# Patient Record
Sex: Female | Born: 1990 | Race: Black or African American | Hispanic: No | Marital: Single | Smoking: Never smoker
Health system: Southern US, Academic
[De-identification: ages and names within clinical notes are randomized; demographics above are authoritative.]

---
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
## 2009-05-04 ENCOUNTER — Emergency Department (HOSPITAL_COMMUNITY): Admission: EM | Admit: 2009-05-04 | Discharge: 2009-05-04 | Payer: Self-pay | Admitting: Family Medicine

## 2009-06-20 ENCOUNTER — Emergency Department (HOSPITAL_COMMUNITY): Admission: EM | Admit: 2009-06-20 | Discharge: 2009-06-20 | Payer: Self-pay | Admitting: Family Medicine

## 2010-05-19 LAB — POCT URINALYSIS DIP (DEVICE)
Glucose, UA: NEGATIVE mg/dL
Hgb urine dipstick: NEGATIVE
Nitrite: NEGATIVE

## 2010-06-03 LAB — POCT URINALYSIS DIP (DEVICE)
Bilirubin Urine: NEGATIVE
Glucose, UA: NEGATIVE mg/dL
Nitrite: NEGATIVE

## 2010-06-03 LAB — WET PREP, GENITAL: Yeast Wet Prep HPF POC: NONE SEEN

## 2010-06-03 LAB — GC/CHLAMYDIA PROBE AMP, GENITAL
Chlamydia, DNA Probe: NEGATIVE
GC Probe Amp, Genital: POSITIVE — AB

## 2010-07-17 ENCOUNTER — Emergency Department (HOSPITAL_COMMUNITY)
Admission: EM | Admit: 2010-07-17 | Discharge: 2010-07-17 | Disposition: A | Discharge status: Home / Self Care | Payer: No Typology Code available for payment source | Attending: Emergency Medicine | Admitting: Emergency Medicine

## 2010-07-17 ENCOUNTER — Inpatient Hospital Stay (INDEPENDENT_AMBULATORY_CARE_PROVIDER_SITE_OTHER)
Admission: RE | Admit: 2010-07-17 | Discharge: 2010-07-17 | Disposition: A | Discharge status: Home / Self Care | Payer: No Typology Code available for payment source | Source: Ambulatory Visit | Attending: Family Medicine | Admitting: Family Medicine

## 2010-07-17 ENCOUNTER — Emergency Department (HOSPITAL_COMMUNITY): Payer: No Typology Code available for payment source

## 2010-07-17 DIAGNOSIS — H5789 Other specified disorders of eye and adnexa: Secondary | ICD-10-CM | POA: Insufficient documentation

## 2010-07-17 DIAGNOSIS — J3489 Other specified disorders of nose and nasal sinuses: Secondary | ICD-10-CM | POA: Insufficient documentation

## 2010-07-17 DIAGNOSIS — H109 Unspecified conjunctivitis: Secondary | ICD-10-CM

## 2012-06-29 ENCOUNTER — Encounter (HOSPITAL_COMMUNITY): Payer: Self-pay | Admitting: *Deleted

## 2012-06-29 ENCOUNTER — Emergency Department (HOSPITAL_COMMUNITY)
Admission: EM | Admit: 2012-06-29 | Discharge: 2012-06-29 | Disposition: A | Discharge status: Home / Self Care | Payer: Medicaid Other | Attending: Emergency Medicine | Admitting: Emergency Medicine

## 2012-06-29 ENCOUNTER — Emergency Department (HOSPITAL_COMMUNITY): Payer: Medicaid Other

## 2012-06-29 DIAGNOSIS — R0602 Shortness of breath: Secondary | ICD-10-CM | POA: Insufficient documentation

## 2012-06-29 DIAGNOSIS — R490 Dysphonia: Secondary | ICD-10-CM | POA: Insufficient documentation

## 2012-06-29 DIAGNOSIS — Z79899 Other long term (current) drug therapy: Secondary | ICD-10-CM | POA: Insufficient documentation

## 2012-06-29 DIAGNOSIS — B9789 Other viral agents as the cause of diseases classified elsewhere: Secondary | ICD-10-CM | POA: Insufficient documentation

## 2012-06-29 DIAGNOSIS — B349 Viral infection, unspecified: Secondary | ICD-10-CM

## 2012-06-29 DIAGNOSIS — R5381 Other malaise: Secondary | ICD-10-CM | POA: Insufficient documentation

## 2012-06-29 DIAGNOSIS — Z3202 Encounter for pregnancy test, result negative: Secondary | ICD-10-CM | POA: Insufficient documentation

## 2012-06-29 DIAGNOSIS — F17200 Nicotine dependence, unspecified, uncomplicated: Secondary | ICD-10-CM | POA: Insufficient documentation

## 2012-06-29 DIAGNOSIS — R000 Tachycardia, unspecified: Secondary | ICD-10-CM | POA: Insufficient documentation

## 2012-06-29 DIAGNOSIS — IMO0001 Reserved for inherently not codable concepts without codable children: Secondary | ICD-10-CM | POA: Insufficient documentation

## 2012-06-29 DIAGNOSIS — J029 Acute pharyngitis, unspecified: Secondary | ICD-10-CM | POA: Insufficient documentation

## 2012-06-29 DIAGNOSIS — R509 Fever, unspecified: Secondary | ICD-10-CM | POA: Insufficient documentation

## 2012-06-29 DIAGNOSIS — R197 Diarrhea, unspecified: Secondary | ICD-10-CM | POA: Insufficient documentation

## 2012-06-29 DIAGNOSIS — R112 Nausea with vomiting, unspecified: Secondary | ICD-10-CM

## 2012-06-29 LAB — URINALYSIS, ROUTINE W REFLEX MICROSCOPIC
Ketones, ur: >80 mg/dL — AB
Nitrite: NEGATIVE
Urobilinogen, UA: 1 mg/dL (ref 0.0–1.0)
pH: 5.5 (ref 5.0–8.0)

## 2012-06-29 LAB — COMPREHENSIVE METABOLIC PANEL
Alkaline Phosphatase: 63 U/L (ref 39–117)
BUN: 10 mg/dL (ref 6–23)
CO2: 25 mEq/L (ref 19–32)
Calcium: 9.4 mg/dL (ref 8.4–10.5)
GFR calc Af Amer: >90 mL/min (ref >90)
GFR calc non Af Amer: >90 mL/min (ref >90)
Glucose, Bld: 77 mg/dL (ref 70–99)
Potassium: 3.2 mEq/L — ABNORMAL LOW (ref 3.5–5.1)
Total Protein: 7.7 g/dL (ref 6.0–8.3)

## 2012-06-29 LAB — CBC WITH DIFFERENTIAL/PLATELET
Basophils Relative: 0 % (ref 0–1)
Eosinophils Absolute: 0.1 10*3/uL (ref 0.0–0.7)
HCT: 36.4 % (ref 36.0–46.0)
Hemoglobin: 11.9 g/dL — ABNORMAL LOW (ref 12.0–15.0)
MCH: 27.5 pg (ref 26.0–34.0)
MCHC: 32.7 g/dL (ref 30.0–36.0)
Monocytes Absolute: 1 10*3/uL (ref 0.1–1.0)
Neutro Abs: 9.6 10*3/uL — ABNORMAL HIGH (ref 1.7–7.7)

## 2012-06-29 LAB — URINE MICROSCOPIC-ADD ON

## 2012-06-29 LAB — RAPID STREP SCREEN (MED CTR MEBANE ONLY): Streptococcus, Group A Screen (Direct): NEGATIVE

## 2012-06-29 MED ORDER — GUAIFENESIN 100 MG/5ML PO LIQD: 100.0000 mg | ORAL | Status: DC | PRN

## 2012-06-29 MED ORDER — POTASSIUM CHLORIDE CRYS ER 20 MEQ PO TBCR
40.0000 meq | EXTENDED_RELEASE_TABLET | Freq: Once | ORAL | Status: AC
  Administered 2012-06-29: 40 meq via ORAL
  Filled 2012-06-29: qty 2

## 2012-06-29 MED ORDER — GI COCKTAIL ~~LOC~~
30.0000 mL | Freq: Once | ORAL | Status: AC
  Administered 2012-06-29: 30 mL via ORAL
  Filled 2012-06-29: qty 30

## 2012-06-29 MED ORDER — ACETAMINOPHEN 500 MG PO TABS: 500.0000 mg | ORAL_TABLET | Freq: Four times a day (QID) | ORAL | Status: DC | PRN

## 2012-06-29 MED ORDER — SODIUM CHLORIDE 0.9 % IV SOLN
1000.0000 mL | Freq: Once | INTRAVENOUS | Status: AC
  Administered 2012-06-29: 1000 mL via INTRAVENOUS

## 2012-06-29 MED ORDER — PROMETHAZINE HCL 25 MG PO TABS: 25.0000 mg | ORAL_TABLET | Freq: Four times a day (QID) | ORAL | Status: DC | PRN

## 2012-06-29 MED ORDER — BENZONATATE 100 MG PO CAPS: 200.0000 mg | ORAL_CAPSULE | Freq: Two times a day (BID) | ORAL | Status: DC | PRN

## 2012-06-29 NOTE — ED Notes (Signed)
Pt states has had n/v/d, weakness since last Monday, but last night started having a dry cough and shortness of breath, denies hx of asthma.

## 2012-06-29 NOTE — ED Notes (Signed)
Admits to vomiting and diarrhea. Vomited Monday and Christine Melendez. Diarrhea everyday.

## 2012-06-29 NOTE — ED Notes (Signed)
Patient  presents today with SOB and hurting when she breathes. States that she hurts all over. All over body ache started 7 days ago and SOB started last night. No one else in household is sick. Has taken ibuprofen but has not had any relief. Throat appears slightly reddened. Has no fever but HR is elevated.

## 2012-06-29 NOTE — ED Notes (Signed)
Denies nausea  

## 2012-06-29 NOTE — ED Provider Notes (Signed)
History     CSN: [id number]  Arrival date & time 06/29/12  1610   First MD Initiated Contact with Patient 06/29/12 1631      Chief Complaint  Patient presents with  . Shortness of Breath  . Nausea  . Emesis  . Diarrhea    (Consider location/radiation/quality/duration/timing/severity/associated sxs/prior treatment) HPI Pt is a 22yo female c/o dry cough that started yesterday associated with n/v/d, sore throat, and bodyaches that started on Monday. Vomiting stopped earlier this week but pt still nauseous and has diarrhea w/o blood or mucous x4 today. Pt reports fever of 102.  Tried ibuprofen on Tues. 4/29 w/o relief so stopped taking.  Denies sick contacts or recent travel. LMP currently on, nl. Denies hx of asthma or cardiac hx.  No immunocompromise.    History reviewed. No pertinent past medical history.  History reviewed. No pertinent past surgical history.  No family history on file.  History  Substance Use Topics  . Smoking status: Current Every Day Smoker  . Smokeless tobacco: Never Used  . Alcohol Use: Yes    OB History   Grav Para Term Preterm Abortions TAB SAB Ect Mult Living                  Review of Systems  Constitutional: Positive for fever, chills and fatigue.  HENT: Positive for sore throat and voice change. Negative for neck pain and neck stiffness.   Respiratory: Positive for cough.   Cardiovascular: Negative for chest pain.  Gastrointestinal: Positive for nausea, vomiting and diarrhea. Negative for abdominal pain.  Musculoskeletal: Positive for myalgias.  All other systems reviewed and are negative.    Allergies  Review of patient's allergies indicates no known allergies.  Home Medications   Current Outpatient Rx  Name  Route  Sig  Dispense  Refill  . acetaminophen (TYLENOL) 500 MG tablet   Oral   Take 1 tablet (500 mg total) by mouth every 6 (six) hours as needed for pain.   30 tablet   0   . benzonatate (TESSALON) 100 MG capsule  Oral   Take 2 capsules (200 mg total) by mouth 2 (two) times daily as needed for cough.   20 capsule   0   . guaiFENesin (ROBITUSSIN) 100 MG/5ML liquid   Oral   Take 5-10 mLs (100-200 mg total) by mouth every 4 (four) hours as needed for cough.   60 mL   0   . promethazine (PHENERGAN) 25 MG tablet   Oral   Take 1 tablet (25 mg total) by mouth every 6 (six) hours as needed for nausea.   12 tablet   0     BP 119/60  Pulse 108  Temp(Src) 98.5 F (36.9 C) (Oral)  Resp 20  SpO2 100%  LMP 05/30/2012  Physical Exam  Nursing note and vitals reviewed. Constitutional: She appears well-developed and well-nourished. No distress.  HENT:  Head: Normocephalic and atraumatic.  Mouth/Throat: Uvula is midline and mucous membranes are normal. Posterior oropharyngeal edema and posterior oropharyngeal erythema present. No oropharyngeal exudate or tonsillar abscesses.  Eyes: Conjunctivae are normal. No scleral icterus.  Neck: Normal range of motion.  Cardiovascular: Regular rhythm and normal heart sounds.  Tachycardia present.   Pulmonary/Chest: Effort normal and breath sounds normal. No respiratory distress.  Abdominal: Soft. Bowel sounds are normal. She exhibits no distension and no mass. There is no tenderness. There is no rebound and no guarding.  Musculoskeletal: Normal range of motion.  Neurological: She  is alert.  Skin: Skin is warm and dry. She is not diaphoretic.    ED Course  Procedures (including critical care time)  Labs Reviewed  CBC WITH DIFFERENTIAL - Abnormal; Notable for the following:    WBC 12.5 (*)    Hemoglobin 11.9 (*)    Neutro Abs 9.6 (*)    All other components within normal limits  COMPREHENSIVE METABOLIC PANEL - Abnormal; Notable for the following:    Potassium 3.2 (*)    All other components within normal limits  URINALYSIS, ROUTINE W REFLEX MICROSCOPIC - Abnormal; Notable for the following:    Color, Urine AMBER (*)    APPearance CLOUDY (*)    Specific  Gravity, Urine 1.032 (*)    Hgb urine dipstick LARGE (*)    Bilirubin Urine SMALL (*)    Ketones, ur >80 (*)    Protein, ur 30 (*)    Leukocytes, UA MODERATE (*)    All other components within normal limits  URINE MICROSCOPIC-ADD ON - Abnormal; Notable for the following:    Squamous Epithelial / LPF FEW (*)    All other components within normal limits  RAPID STREP SCREEN  URINE CULTURE  PREGNANCY, URINE   Dg Chest 2 View  06/29/2012  *RADIOLOGY REPORT*  Clinical Data: Shortness of breath.  CHEST - 2 VIEW  Comparison: 03/12/2004  Findings: Two views of the chest demonstrate clear lungs. Heart and mediastinum are within normal limits.  The trachea is midline. Bony thorax is intact.  IMPRESSION: No acute cardiopulmonary disease.   Original Report Authenticated By: Richarda Overlie, M.D.      1. Nausea vomiting and diarrhea   2. Pharyngitis with viral syndrome       MDM  Pt c/o cough with sore throat, associated with 1wk of n/v/d.  Vomiting has subsided but pt still experiencing non-bloody, non-mucous diarrhea.   Strep Neg. CMP: mild hypokalemia 3.2 Gave 40mg  Kdur in ED CBC: mildly elevated wbc at 12.5 UA: will have cultured. Pt not experiencing urinary symptoms at this time.  Will not tx today.  CXR nl.  Pt c/o worsened throat pain after strep test performed.  Will give GI cocktail. Tx symptomatically.  Discharge pt home. Rx: acetaminophen, tessalon pearls, guaifenesin.  Have pt f/u with [facility] to find PCP or f/u with [facility].  Return precautions given.   Discussed with Dr. Patria Mane who agreed with plan.  Vitals: unremarkable. Discharged in stable condition.    Discussed pt with attending during ED encounter.        Junius Finner, PA-C 06/29/12 2105

## 2012-06-30 LAB — URINE CULTURE

## 2012-06-30 NOTE — ED Provider Notes (Signed)
Medical screening examination/treatment/procedure(s) were performed by non-physician practitioner and as supervising physician I was immediately available for consultation/collaboration.  Lyanne Co, MD 06/30/12 [phone]

## 2012-08-07 ENCOUNTER — Emergency Department (HOSPITAL_COMMUNITY)
Admission: EM | Admit: 2012-08-07 | Discharge: 2012-08-07 | Disposition: A | Discharge status: Home / Self Care | Payer: Medicaid Other | Attending: Emergency Medicine | Admitting: Emergency Medicine

## 2012-08-07 ENCOUNTER — Encounter (HOSPITAL_COMMUNITY): Payer: Self-pay

## 2012-08-07 ENCOUNTER — Emergency Department (HOSPITAL_COMMUNITY): Payer: Medicaid Other

## 2012-08-07 DIAGNOSIS — S0512XA Contusion of eyeball and orbital tissues, left eye, initial encounter: Secondary | ICD-10-CM

## 2012-08-07 DIAGNOSIS — T7411XA Adult physical abuse, confirmed, initial encounter: Secondary | ICD-10-CM | POA: Insufficient documentation

## 2012-08-07 DIAGNOSIS — S0510XA Contusion of eyeball and orbital tissues, unspecified eye, initial encounter: Secondary | ICD-10-CM | POA: Insufficient documentation

## 2012-08-07 DIAGNOSIS — F17200 Nicotine dependence, unspecified, uncomplicated: Secondary | ICD-10-CM | POA: Insufficient documentation

## 2012-08-07 MED ORDER — TETRACAINE HCL 0.5 % OP SOLN
2.0000 [drp] | Freq: Once | OPHTHALMIC | Status: AC
  Administered 2012-08-07: 2 [drp] via OPHTHALMIC
  Filled 2012-08-07: qty 2

## 2012-08-07 MED ORDER — OXYCODONE-ACETAMINOPHEN 5-325 MG PO TABS: 1.0000 | ORAL_TABLET | Freq: Four times a day (QID) | ORAL | Status: DC | PRN

## 2012-08-07 MED ORDER — ONDANSETRON 4 MG PO TBDP
4.0000 mg | ORAL_TABLET | Freq: Once | ORAL | Status: AC
  Administered 2012-08-07: 4 mg via ORAL
  Filled 2012-08-07: qty 1

## 2012-08-07 MED ORDER — OXYCODONE-ACETAMINOPHEN 5-325 MG PO TABS
2.0000 | ORAL_TABLET | Freq: Once | ORAL | Status: AC
  Administered 2012-08-07: 2 via ORAL
  Filled 2012-08-07: qty 2

## 2012-08-07 MED ORDER — FLUORESCEIN SODIUM 1 MG OP STRP
1.0000 | ORAL_STRIP | Freq: Once | OPHTHALMIC | Status: DC
  Filled 2012-08-07: qty 1

## 2012-08-07 NOTE — ED Notes (Signed)
Pt has returned from CT. No signs of distress.
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
## 2012-08-07 NOTE — ED Provider Notes (Signed)
Medical screening examination/treatment/procedure(s) were performed by non-physician practitioner and as supervising physician I was immediately available for consultation/collaboration.   Cleotis Sparr, MD 08/07/12 1600 

## 2012-08-07 NOTE — ED Notes (Signed)
Patient transported to CT 

## 2013-08-23 ENCOUNTER — Emergency Department (HOSPITAL_COMMUNITY)
Admission: EM | Admit: 2013-08-23 | Discharge: 2013-08-24 | Disposition: A | Discharge status: Home / Self Care | Payer: Medicaid Other | Attending: Emergency Medicine | Admitting: Emergency Medicine

## 2013-08-23 ENCOUNTER — Encounter (HOSPITAL_COMMUNITY): Payer: Self-pay | Admitting: Emergency Medicine

## 2013-08-23 DIAGNOSIS — F17200 Nicotine dependence, unspecified, uncomplicated: Secondary | ICD-10-CM | POA: Insufficient documentation

## 2013-08-23 DIAGNOSIS — Y9389 Activity, other specified: Secondary | ICD-10-CM | POA: Insufficient documentation

## 2013-08-23 DIAGNOSIS — T148XXA Other injury of unspecified body region, initial encounter: Secondary | ICD-10-CM

## 2013-08-23 DIAGNOSIS — Z3202 Encounter for pregnancy test, result negative: Secondary | ICD-10-CM | POA: Insufficient documentation

## 2013-08-23 DIAGNOSIS — S298XXA Other specified injuries of thorax, initial encounter: Secondary | ICD-10-CM | POA: Insufficient documentation

## 2013-08-23 DIAGNOSIS — Y92410 Unspecified street and highway as the place of occurrence of the external cause: Secondary | ICD-10-CM | POA: Insufficient documentation

## 2013-08-23 DIAGNOSIS — IMO0002 Reserved for concepts with insufficient information to code with codable children: Secondary | ICD-10-CM | POA: Insufficient documentation
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
## 2013-08-24 ENCOUNTER — Emergency Department (HOSPITAL_COMMUNITY): Payer: Medicaid Other

## 2013-08-24 LAB — POC URINE PREG, ED: Preg Test, Ur: NEGATIVE

## 2013-08-24 MED ORDER — IBUPROFEN 800 MG PO TABS
800.0000 mg | ORAL_TABLET | Freq: Once | ORAL | Status: AC
  Administered 2013-08-24: 800 mg via ORAL
  Filled 2013-08-24: qty 1

## 2013-08-24 MED ORDER — MELOXICAM 15 MG PO TABS: 15.0000 mg | ORAL_TABLET | Freq: Every day | ORAL | Status: AC

## 2013-08-24 MED ORDER — CYCLOBENZAPRINE HCL 10 MG PO TABS: 10.0000 mg | ORAL_TABLET | Freq: Three times a day (TID) | ORAL | Status: AC | PRN
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
## 2013-08-25 NOTE — ED Provider Notes (Signed)
Medical screening examination/treatment/procedure(s) were performed by non-physician practitioner and as supervising physician I was immediately available for consultation/collaboration.   EKG Interpretation None       Olga M Otter, MD 08/25/13 0550 

## 2013-09-21 ENCOUNTER — Emergency Department (HOSPITAL_COMMUNITY)
Admission: EM | Admit: 2013-09-21 | Discharge: 2013-09-21 | Discharge status: Absent without leave | Payer: Self-pay | Source: Home / Self Care

## 2013-09-21 NOTE — ED Notes (Signed)
This pt was called in all waiting areas - no response from this pt. 

## 2013-09-21 NOTE — ED Notes (Signed)
Called in all waiting areas - no response. Per Darin EngelsAbraham, who registered this pt, he does not see the pt in the waiting areas.

## 2013-09-21 NOTE — ED Notes (Signed)
This pt was called in all waiting areas w/ no response. 

## 2014-01-19 ENCOUNTER — Other Ambulatory Visit (HOSPITAL_COMMUNITY): Payer: Self-pay
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
## 2021-08-31 ENCOUNTER — Emergency Department
Admission: EM | Admit: 2021-08-31 | Discharge: 2021-08-31 | Disposition: A | Discharge status: Home / Self Care | Payer: MEDICAID | Attending: Emergency Medicine | Admitting: Emergency Medicine

## 2021-08-31 ENCOUNTER — Other Ambulatory Visit: Payer: Self-pay

## 2021-08-31 ENCOUNTER — Encounter (HOSPITAL_BASED_OUTPATIENT_CLINIC_OR_DEPARTMENT_OTHER): Payer: Self-pay

## 2021-08-31 DIAGNOSIS — G43909 Migraine, unspecified, not intractable, without status migrainosus: Secondary | ICD-10-CM

## 2021-08-31 DIAGNOSIS — Z6830 Body mass index (BMI) 30.0-30.9, adult: Secondary | ICD-10-CM

## 2021-08-31 DIAGNOSIS — R42 Dizziness and giddiness: Secondary | ICD-10-CM

## 2021-08-31 MED ORDER — PROMETHAZINE 25 MG TABLET
25.0000 mg | ORAL_TABLET | Freq: Three times a day (TID) | ORAL | 0 refills | Status: DC | PRN
Start: 2021-08-31 — End: 2022-01-10

## 2021-08-31 MED ORDER — PROMETHAZINE 25 MG TABLET
ORAL_TABLET | ORAL | Status: AC
Start: 2021-08-31 — End: 2021-08-31
  Filled 2021-08-31: qty 1

## 2021-08-31 MED ORDER — KETOROLAC 10 MG TABLET
10.0000 mg | ORAL_TABLET | Freq: Four times a day (QID) | ORAL | 0 refills | Status: DC | PRN
Start: 2021-08-31 — End: 2022-01-10

## 2021-08-31 MED ORDER — KETOROLAC 60 MG/2 ML INTRAMUSCULAR SOLUTION
INTRAMUSCULAR | Status: AC
Start: 2021-08-31 — End: 2021-08-31
  Filled 2021-08-31: qty 2

## 2021-08-31 MED ORDER — METHYLPREDNISOLONE ACETATE 40 MG/ML SUSPENSION FOR INJECTION
INTRAMUSCULAR | Status: AC
Start: 2021-08-31 — End: 2021-08-31
  Filled 2021-08-31: qty 2

## 2021-08-31 MED ORDER — PROMETHAZINE 25 MG TABLET
25.0000 mg | ORAL_TABLET | ORAL | Status: AC
Start: 2021-08-31 — End: 2021-08-31
  Administered 2021-08-31: 25 mg via ORAL

## 2021-08-31 MED ORDER — METHYLPREDNISOLONE ACETATE 40 MG/ML SUSPENSION FOR INJECTION
80.0000 mg | Freq: Once | INTRAMUSCULAR | Status: AC
Start: 2021-08-31 — End: 2021-08-31
  Administered 2021-08-31: 80 mg via INTRAMUSCULAR

## 2021-08-31 MED ORDER — KETOROLAC 60 MG/2 ML INTRAMUSCULAR SOLUTION
60.0000 mg | INTRAMUSCULAR | Status: AC
Start: 2021-08-31 — End: 2021-08-31
  Administered 2021-08-31: 60 mg via INTRAMUSCULAR

## 2021-08-31 NOTE — ED Provider Notes (Signed)
[facility], [facility] Emergency Department  ED Primary Provider Note  History of Present Illness   Chief Complaint   Patient presents with   . Headache     Linda Mccarty is a 31 y.o. female who had concerns including Headache.  Arrival: The patient arrived by Car complaining of left-sided headache that is behind the left with nausea but no vomiting for the past 5 days.  Patient states she was lightheaded and dizzy today because of the headache.  She felt like she was going to fall.  She denies any vertigo-like symptoms.  Patient denies any one-sided weakness.  No numbness or tingling on 1 side.  Patient has had a history of migraines in the past.  She states she normally only took Motrin and the pain would go away.  Patient states that she is been taking Motrin today without relief.  Patient denies any or.  Patient denies any photophobia.    HPI  Review of Systems   Review of Systems   Constitutional: Positive for activity change and appetite change. Negative for chills and fever.   HENT: Negative for ear pain and sore throat.    Eyes: Negative for pain and visual disturbance.   Respiratory: Negative for cough and shortness of breath.    Cardiovascular: Negative for chest pain and palpitations.   Gastrointestinal: Positive for nausea. Negative for abdominal pain and vomiting.   Genitourinary: Negative for dysuria and hematuria.   Musculoskeletal: Negative for arthralgias and back pain.   Skin: Negative for color change and rash.   Neurological: Positive for dizziness, light-headedness and headaches. Negative for seizures and syncope.   All other systems reviewed and are negative.     Historical Data   History Reviewed This Encounter:     Physical Exam   ED Triage Vitals [08/31/21 1733]   BP (Non-Invasive) 117/73   Heart Rate 74   Respiratory Rate 16   Temperature 37.2 C (99 F)   SpO2 100 %   Weight 77.1 kg (170 lb)   Height 1.6 m (5\' 3" )     Physical Exam  Vitals and nursing  note reviewed.   Constitutional:       General: She is not in acute distress.     Appearance: She is well-developed. She is obese.   HENT:      Head: Normocephalic and atraumatic.      Right Ear: External ear normal.      Left Ear: External ear normal.      Nose: Nose normal.      Mouth/Throat:      Mouth: Mucous membranes are moist.   Eyes:      Extraocular Movements: Extraocular movements intact.      Conjunctiva/sclera: Conjunctivae normal.      Pupils: Pupils are equal, round, and reactive to light.   Cardiovascular:      Rate and Rhythm: Normal rate and regular rhythm.      Pulses: Normal pulses.      Heart sounds: Normal heart sounds. No murmur heard.  Pulmonary:      Effort: Pulmonary effort is normal. No respiratory distress.      Breath sounds: Normal breath sounds.   Abdominal:      General: Bowel sounds are normal.      Palpations: Abdomen is soft.      Tenderness: There is no abdominal tenderness.   Musculoskeletal:         General: No swelling. Normal range  of motion.      Cervical back: Normal range of motion and neck supple.   Skin:     General: Skin is warm and dry.      Capillary Refill: Capillary refill takes less than 2 seconds.   Neurological:      General: No focal deficit present.      Mental Status: She is alert and oriented to person, place, and time.   Psychiatric:         Mood and Affect: Mood normal.         Thought Content: Thought content normal.         Judgment: Judgment normal.       Patient Data   Labs Ordered/Reviewed - No data to display  No orders to display     Medical Decision Making        Medical Decision Making  Patient is 31 year old black female complaining of having a migraine for the past 5 days.  Patient states the headache has been behind her left eye radiating to the back of her head.  Patient denies any fever chills.  Patient has had nausea but no vomiting.  Patient has no photophobia or any or prior to the headache starting.  Patient states loud noises do not affect  the headache.  She denies any one-sided weakness.  No numbness or tingling in the extremity.  Patient has had migraines in the past just have never lasted this long.  Patient will receive analgesia as well as a steroid injection and medication for nausea.  Patient will then be sent home for sleep.    Risk  Prescription drug management.               Medications Administered in the ED   ketorolac (TORADOL) 60mg /2 mL IM injection (60 mg IntraMUSCULAR Given 08/31/21 1747)   promethazine (PHENERGAN) tablet (25 mg Oral Given 08/31/21 1747)   methylPREDNISolone acetate (DEPO-MEDROL) 40 mg/mL injection (80 mg IntraMUSCULAR Given 08/31/21 1747)     Clinical Impression   Migraine headache (Primary)   Dizziness       Disposition: Discharged               Clinical Impression   Migraine headache (Primary)   Dizziness       Current Discharge Medication List      START taking these medications    Details   ketorolac tromethamine (TORADOL) 10 mg Oral Tablet Take 1 Tablet (10 mg total) by mouth Every 6 hours as needed for Pain  Qty: 20 Tablet, Refills: 0      promethazine (PHENERGAN) 25 mg Oral Tablet Take 1 Tablet (25 mg total) by mouth Every 8 hours as needed for Nausea/Vomiting  Qty: 12 Tablet, Refills: 0

## 2021-08-31 NOTE — ED Nurses Note (Signed)
Patient discharged home with family.  AVS reviewed with patient/care giver.  A written copy of the AVS and discharge instructions was given to the patient/care giver. Scripts handed to patient/care giver. Questions sufficiently answered as needed.  Patient/care giver encouraged to follow up with PCP as indicated.  In the event of an emergency, patient/care giver instructed to call 911 or go to the nearest emergency room.

## 2021-08-31 NOTE — ED Triage Notes (Signed)
Patient c/o left sided headache, states the pain is now behind her left eye. Also states she had a dizzy episode 1.5 hours PTA. Denies sensitivity to light or sound

## 2021-10-14 ENCOUNTER — Other Ambulatory Visit: Payer: Self-pay

## 2021-10-14 ENCOUNTER — Emergency Department (HOSPITAL_COMMUNITY): Payer: MEDICAID

## 2021-10-14 ENCOUNTER — Emergency Department
Admission: EM | Admit: 2021-10-14 | Discharge: 2021-10-14 | Disposition: A | Discharge status: Home / Self Care | Payer: MEDICAID | Attending: Emergency Medicine | Admitting: Emergency Medicine

## 2021-10-14 DIAGNOSIS — Z9889 Other specified postprocedural states: Secondary | ICD-10-CM | POA: Insufficient documentation

## 2021-10-14 DIAGNOSIS — N939 Abnormal uterine and vaginal bleeding, unspecified: Secondary | ICD-10-CM | POA: Insufficient documentation

## 2021-10-14 DIAGNOSIS — N8311 Corpus luteum cyst of right ovary: Secondary | ICD-10-CM

## 2021-10-14 DIAGNOSIS — N3001 Acute cystitis with hematuria: Secondary | ICD-10-CM | POA: Insufficient documentation

## 2021-10-14 LAB — COMPREHENSIVE METABOLIC PANEL, NON-FASTING
ALBUMIN/GLOBULIN RATIO: 1.6 — ABNORMAL HIGH (ref 0.8–1.4)
ALBUMIN: 4.4 g/dL (ref 3.5–5.7)
ALKALINE PHOSPHATASE: 53 U/L (ref 34–104)
ALT (SGPT): 13 U/L (ref 7–52)
ANION GAP: 12 mmol/L (ref 4–13)
AST (SGOT): 18 U/L (ref 13–39)
BILIRUBIN TOTAL: 0.5 mg/dL (ref 0.3–1.2)
BUN/CREA RATIO: 17 (ref 6–22)
BUN: 14 mg/dL (ref 7–25)
CALCIUM, CORRECTED: 8.9 mg/dL (ref 8.9–10.8)
CALCIUM: 9.3 mg/dL (ref 8.6–10.3)
CHLORIDE: 109 mmol/L — ABNORMAL HIGH (ref 98–107)
CO2 TOTAL: 17 mmol/L — ABNORMAL LOW (ref 21–31)
CREATININE: 0.81 mg/dL (ref 0.60–1.30)
ESTIMATED GFR: 99 mL/min/{1.73_m2} (ref >59)
GLOBULIN: 2.8 — ABNORMAL LOW (ref 2.9–5.4)
GLUCOSE: 88 mg/dL (ref 74–109)
OSMOLALITY, CALCULATED: 276 mOsm/kg (ref 270–290)
POTASSIUM: 4.1 mmol/L (ref 3.5–5.1)
PROTEIN TOTAL: 7.2 g/dL (ref 6.4–8.9)
SODIUM: 138 mmol/L (ref 136–145)

## 2021-10-14 LAB — CBC WITH DIFF
BASOPHIL #: 0 10*3/uL (ref 0.00–0.30)
BASOPHIL %: 1 % (ref 0–3)
EOSINOPHIL #: 0.1 10*3/uL (ref 0.00–0.80)
EOSINOPHIL %: 1 % (ref 0–7)
HCT: 38.1 % (ref 37.0–47.0)
HGB: 12.3 g/dL — ABNORMAL LOW (ref 12.5–16.0)
LYMPHOCYTE #: 1.8 10*3/uL (ref 1.10–5.00)
LYMPHOCYTE %: 19 % — ABNORMAL LOW (ref 25–45)
MCH: 27.9 pg (ref 27.0–32.0)
MCHC: 32.2 g/dL (ref 32.0–36.0)
MCV: 86.6 fL (ref 78.0–99.0)
MONOCYTE #: 0.6 10*3/uL (ref 0.00–1.30)
MONOCYTE %: 6 % (ref 0–12)
NEUTROPHIL #: 7 10*3/uL (ref 1.80–8.40)
NEUTROPHIL %: 74 % (ref 40–76)
RBC COMMENT: NORMAL
RBC: 4.4 10*6/uL (ref 4.20–5.40)
RDW: 13.6 % (ref 11.6–14.8)
WBC: 9.5 10*3/uL (ref 4.0–10.5)
WBCS UNCORRECTED: 9.5 10*3/uL

## 2021-10-14 LAB — URINALYSIS, MACROSCOPIC
BILIRUBIN: NEGATIVE mg/dL
BLOOD: 1 mg/dL — AB
GLUCOSE: NEGATIVE mg/dL
KETONES: NEGATIVE mg/dL
LEUKOCYTES: 500 WBCs/uL — AB
NITRITE: NEGATIVE
PH: 7.5 (ref 5.0–9.0)
PROTEIN: 70 mg/dL — AB
SPECIFIC GRAVITY: 1.026 (ref 1.002–1.030)
UROBILINOGEN: NORMAL mg/dL

## 2021-10-14 LAB — URINALYSIS, MICROSCOPIC
RBCS: 1173 /hpf — ABNORMAL HIGH (ref <4)
SQUAMOUS EPITHELIAL: 14 /hpf (ref <28)
WBCS: 347 /hpf — ABNORMAL HIGH (ref <6)

## 2021-10-14 LAB — HCG, PLASMA OR SERUM QUANTITATIVE, PREGNANCY: HCG, QUANTITATIVE: 1605 m[IU]/mL — ABNORMAL HIGH (ref <=3.00)

## 2021-10-14 LAB — ABO & RH: ABO/RH(D): A POS

## 2021-10-14 NOTE — ED Triage Notes (Signed)
Patient reports that she had a medical abortion 10 days ago.  Reports continued vaginal draiange, foul odor.  Complaining of vaginal discomfort.  [facility] for eval due to abnormal labs.

## 2021-10-14 NOTE — Discharge Instructions (Signed)
Your blood work today was all normal exception of the pregnancy test.  It is common for the pregnancy test to still be elevated after an abortion.  However this is more than I would expect at 2 weeks.  Therefore you need to have a beta-hCG quantitative test in 48 hours.  This will be sent to [facility] in Dr. Dahlia Client office.  Call Dr. Dahlia Client office for an appointment with [facility] on Monday morning.  You do need to be seen this week.  Tylenol every 6 hours alternating with Motrin every 6 hours as needed pain

## 2021-10-14 NOTE — ED Provider Notes (Signed)
[facility]  ED Primary Provider Note  Patient Name: Linda Mccarty  Patient Age: 31 y.o.  Date of Birth: [birth year]    Chief Complaint: Vaginal Bleeding        History of Present Illness       Linda Mccarty is a 31 y.o. female who had concerns including Vaginal Bleeding.     HPI     Chief complaint vaginal discharge.      HPI   Quality vaginal bleeding that is now turning foul odor.  Onset gradual   Duration constant   Timing 10 days   Mechanism:  Patient had an elective abortion chemically induced.  She states she took the 1st pill 10 days ago and is due to take a 2nd pill today.  Context: Patient was sent here by [facility] because she still has a positive pregnancy test.  Patient denies any overt abdominal pain.  However she does have a purulent vaginal discharge that is brown mixed with blood.  She states there is a foul smell.  She also states she has dysuria and frequency.  Patient has no fever or chills.  She has no malaise.  She has no abdominal pain.  PMH: reviewed and listed on the chart below.   SH: reviewed and listed on the chart below.         PMHx:    No past medical history on file. PSHx:   No past surgical history on file.    Allergies:    No Known Allergies Social History  Social History     Tobacco Use    Smoking status: Never    Smokeless tobacco: Never   Substance Use Topics    Alcohol use: Never    Drug use: Never       Family History  Family Medical History:    None            Home Meds:      Prior to Admission medications    Medication Sig Start Date End Date Taking? Authorizing Provider   ketorolac tromethamine (TORADOL) 10 mg Oral Tablet Take 1 Tablet (10 mg total) by mouth Every 6 hours as needed for Pain 08/31/21   Ammie Ferrier, MD   promethazine (PHENERGAN) 25 mg Oral Tablet Take 1 Tablet (25 mg total) by mouth Every 8 hours as needed for Nausea/Vomiting 08/31/21   Ammie Ferrier, MD          ROS: No other overt Review of Systems are noted to be  positive except noted in the HPI.        Physical Exam   ED Triage Vitals [10/14/21 1208]   BP (Non-Invasive) 123/68   Heart Rate 87   Respiratory Rate 18   Temperature (!) 35.9 C (96.7 F)   SpO2 100 %   Weight 78.9 kg (174 lb)   Height 1.6 m ('5\' 3"'$ )         Physical exam  Constitutional/general: The nursing notes were reviewed and agreed upon.  The vital signs were reviewed and are listed on the chart.   31 year old [place] American female who has in no acute distress.  She appears to be comfortable and in no pain.    HEENT: Eyes show normal extraocular movements.  Well-hydrated oral mucosa is noted.  There is no facial trauma or abnormalities.  Neck: No midline tenderness, no meningeal signs.  Full range of motion.  No JVD.  No carotid bruits.  Cardiovascular: Heart is regular rate and rhythm S1-S2 sounds were auscultated without murmur click or rub  Respiratory: Lungs are clear to auscultation in all fields without wheeze rale or rhonchi.  GI: Abdomen is soft non tender normal bowel sounds are auscultated.  There is no rebound tenderness or guarding  Neuro cranial nerves II through XII are intact and normal.  Patient has normal speech and normal gait.  There is no muscle weakness in any extremities.  Sensation is intact throughout.   Psych: Patient is alert and oriented person place and time.  Patient is very pleasant converse with has a euthymic affect.  There are no signs of depression or anxiety.  Skin: No rash.  No petechiae or purpura.  The skin is warm and dry without diaphoresis.  There is no pallor.  Musculoskeletal: There is no tenderness to palpation in any body region.  There is no lower extremity edema and no calf tenderness.  Negative Homan's sign bilaterally.  GU: Deferred        Procedures      Patient Data     Labs Ordered/Reviewed   COMPREHENSIVE METABOLIC PANEL, NON-FASTING - Abnormal; Notable for the following components:       Result Value    CHLORIDE 109 (*)     CO2 TOTAL 17 (*)      ALBUMIN/GLOBULIN RATIO 1.6 (*)     GLOBULIN 2.8 (*)     All other components within normal limits    Narrative:     Estimated Glomerular Filtration Rate (eGFR) is calculated using the CKD-EPI (2021) equation, intended for patients 49 years of age and older. If gender is not documented or "unknown", there will be no eGFR calculation.   HCG, PLASMA OR SERUM QUANTITATIVE, PREGNANCY - Abnormal; Notable for the following components:    HCG, QUANTITATIVE 1,605.00 (*)     All other components within normal limits    Narrative:                                       HCG EXPECTED VALUES    NON PREGNANT FEMALE  FEMALE ADULT    THE CONCENTRATION OF HCG RISES RAPIDLY DURING EARLY PREGNANCY. A MAXIMUM LEVEL OF 5,000 to 200,000 mIU/mL IS REACHED AT 10-12 WEEKS, FOLLOWED BY A SLOW DECLINE TO LEVELS OF 1,000 TO 50,000 mIU/mL DURING THE THIRD TRIMESTER.    HCG RESULTS BETWEEN 5 mIU/mL AND 25 mIU/mL MAY BE INDICATIVE OF EARLY PREGNANCY BUT SHOULD BE INTERPRETED IN LIGHT OF THE TOTAL CLINICAL PRESENTATION OF THE PATIENT.    WHEN BORDERLINE RESULTS ARE ENCOUNTERED, PATIENT SAMPLES SHOULD BE REDRAWM 48 HOURS LATER.    Weeks Post LMP                   Approximate hCG                                    Range (mIU/mL)    0.2-1 WK                           5-50              mIU/mL        1-2   WK  50-500            mIU/mL  2-3   WK                           100-5,000         mIU/mL  3-4   WK                           500-10,000        mIU/mL  4-5   WK                           1,000-50,000      mIU/mL  5-6   WK                           10,000-100,000    mIU/mL  6-8   WK                           15,000-200,000    mIU/mL  2-3   MONTHS                       10,000-100,000    mIU/mL    ref: Clinical Guide to Laboratory Tests 3rd Edition Terrilee Croak.     CBC WITH DIFF - Abnormal; Notable for the following components:    HGB 12.3 (*)     LYMPHOCYTE % 19 (*)     All other components within normal limits   URINALYSIS,  MACROSCOPIC - Abnormal; Notable for the following components:    COLOR Orange (*)     APPEARANCE Ex. Turbid (*)     LEUKOCYTES 500 (*)     PROTEIN 70 (*)     BLOOD 1.0 (*)     All other components within normal limits    Narrative:     Substances that cause abnormal urine color may affect the readability of the urinalysis reagent strips. The color development on the reagant pad may be masked and could be interpreted as a false positive.     URINALYSIS, MICROSCOPIC - Abnormal; Notable for the following components:    MUCOUS Rare (*)     AMORPHOUS SEDIMENT Rare (*)     RBCS 1,173 (*)     WBCS 347 (*)     WHITE BLOOD CELL CLUMP Many (*)     All other components within normal limits   CBC/DIFF    Narrative:     The following orders were created for panel order CBC/DIFF.  Procedure                               Abnormality         Status                     ---------                               -----------         ------                     CBC WITH WNIO[[id number]]  Abnormal            Final result                 Please view results for these tests on the individual orders.   URINALYSIS, MACROSCOPIC AND MICROSCOPIC W/CULTURE REFLEX    Narrative:     The following orders were created for panel order URINALYSIS, MACROSCOPIC AND MICROSCOPIC W/CULTURE REFLEX [PRN ONLY].  Procedure                               Abnormality         Status                     ---------                               -----------         ------                     URINALYSIS, MACROSCOPIC[[id number]]      Abnormal            Final result               URINALYSIS, MICROSCOPIC[[id number]]      Abnormal            Final result                 Please view results for these tests on the individual orders.   ABO & RH       US TRANSVAGINAL OB  [id number]   Final Result by Edi, [facility] In (08/19 1320)   NO INTRAUTERINE PREGNANCY IDENTIFIED. BLOOD PRODUCTS ARE PRESENT WITHIN THE ENDOMETRIUM. CORPUS LUTEUM CYST IS PRESENT ON THE RIGHT.          Radiologist location ID: [place] Decision Making  The patient is ABO A positive and does not need RhoGAM.      She is concerned that she may have an endometrial infection due to the smell.  Currently I am still waiting on her CBC.  However her vital signs are stable she is afebrile, she is not tachycardic and her abdominal exam is normal.  Overall I do not feel that she has endometritis.  Furthermore, there was no instrumentation into the uterus at all this was a chemical elective abortion.    Local obstetrician/gynecologist: She normally follows with [facility] at Dr. Johnnette Gourd office.  She states she can follow-up with her for the follow-up beta-hCG.                                       Clinical Impression   Status post elective abortion (Primary)   Vaginal bleeding   Acute cystitis with hematuria           Discharged      Discharge Medication List as of 10/14/2021  4:08 PM        Will call in Levaquin 500 mg once  a day             _______________________________  Darryll Capers, D.O.  Emergency Medicine  [place]  This note may have been partially generated using MModal Fluency Direct system, and there may be some incorrect words, spellings, and punctuation that were not noted in checking the note before saving, though effort was made to avoid such errors.

## 2021-10-14 NOTE — ED Nurses Note (Signed)
Patient discharged home.  AVS reviewed with patient.  A written copy of the AVS and discharge instructions was given to the patient.  Questions sufficiently answered as needed.  Patient/care giver encouraged to follow up with PCP as indicated.  In the event of an emergency, patient/care giver instructed to call 911 or go to the nearest emergency room. Patient left ED via ambulation.

## 2021-10-16 LAB — URINE CULTURE,ROUTINE: URINE CULTURE: 10000 — AB

## 2022-01-10 ENCOUNTER — Other Ambulatory Visit: Payer: Self-pay

## 2022-01-10 ENCOUNTER — Ambulatory Visit (INDEPENDENT_AMBULATORY_CARE_PROVIDER_SITE_OTHER): Payer: MEDICAID | Admitting: OTOLARYNGOLOGY

## 2022-01-10 ENCOUNTER — Encounter (INDEPENDENT_AMBULATORY_CARE_PROVIDER_SITE_OTHER): Payer: Self-pay | Admitting: OTOLARYNGOLOGY

## 2022-01-10 VITALS — Ht 63.0 in | Wt 187.0 lb

## 2022-01-10 DIAGNOSIS — J342 Deviated nasal septum: Secondary | ICD-10-CM

## 2022-01-10 DIAGNOSIS — J3089 Other allergic rhinitis: Secondary | ICD-10-CM

## 2022-01-10 DIAGNOSIS — J351 Hypertrophy of tonsils: Secondary | ICD-10-CM

## 2022-01-10 DIAGNOSIS — G4730 Sleep apnea, unspecified: Secondary | ICD-10-CM

## 2022-01-10 DIAGNOSIS — R0683 Snoring: Secondary | ICD-10-CM

## 2022-01-10 NOTE — H&P (Signed)
ENT, [facility]  [address]  [place] [address]  Phone: [phone]  Fax: [phone]      Encounter Date: 01/10/2022    Patient ID: Linda Mccarty  MRN: [id number]    DOB: Jun 02, 1990  Age: 31 y.o. female        Referring Provider:    Dolores Frame, FNP  [address]  [place],  [place] [address]    Reason for Visit:   Chief Complaint   Patient presents with    Enlarged Tonsils     New patient here for tonsil eval. Reports snoring.         History of Present Illness:  Linda Mccarty is a 31 y.o. female who is referred for eval of snoring.  She c/o loud snoring, nonrestorative sleep and daytime somnolence. Denies apneas. She rarely has nasal congestion. She saw Dr. Allena Katz and had ? Home PSG which was normal reportedly.  Gets morning headaches      Patient History:  There is no problem list on file for this patient.    No current outpatient medications on file.     No Known Allergies  History reviewed. No pertinent past medical history.   History reviewed. No pertinent surgical history.   Family Medical History:    None         Social History     Tobacco Use    Smoking status: Never    Smokeless tobacco: Never   Substance Use Topics    Alcohol use: Never    Drug use: Never       Review of Systems:  Review of Systems    Physical Exam:  Ht 1.6 m (5\' 3" )   Wt 84.8 kg (187 lb)   BMI 33.13 kg/m       Physical Exam  Constitutional:       Appearance: Normal appearance. She is well-developed, well-groomed and normal weight.   HENT:      Head: Normocephalic and atraumatic.      Right Ear: Hearing, tympanic membrane, ear canal and external ear normal.      Left Ear: Hearing, tympanic membrane, ear canal and external ear normal.      Nose: Septal deviation and mucosal edema present.      Right Turbinates: Enlarged.      Left Turbinates: Enlarged.      Mouth/Throat:      Lips: Pink.      Mouth: Mucous membranes are moist.      Pharynx: Oropharynx is clear. Uvula midline.      Tonsils: 2+ on the right. 2+ on  the left.   Eyes:      Extraocular Movements: Extraocular movements intact.   Neck:      Trachea: Phonation normal.   Pulmonary:      Effort: Pulmonary effort is normal.   Musculoskeletal:      Cervical back: Normal range of motion and neck supple.   Lymphadenopathy:      Cervical: No cervical adenopathy.   Skin:     General: Skin is warm.   Neurological:      Mental Status: She is alert and oriented to person, place, and time.      Cranial Nerves: Cranial nerves 2-12 are intact. No facial asymmetry.   Psychiatric:         Attention and Perception: Attention normal.         Mood and Affect: Mood normal.  Speech: Speech normal.         Behavior: Behavior normal. Behavior is cooperative.         Assessment:  ENCOUNTER DIAGNOSES     ICD-10-CM   1. Loud snoring  R06.83   2. 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  Refer to sleep medicine RE:  Snoring, 2nd opinion poss OSA  Nasal saline b.i.d.  OTC Flonase daily  Orders Placed This Encounter    [address]75 - LARYNGOSCOPY, FLEXIBLE DIAGNOSTIC (AMB ONLY)    Referral to SLEEP MEDICINE [facility] - [facility]     Follow-up in 4 months or sooner PRN    The advanced practice clinician's documentation was reviewed/amended in its entirety with the assessment and plan portion completely performed independently by me during this encounter.    Lonia Farber, DO      Marcelline Deist, PA-C  01/10/2022, 15:09

## 2022-02-21 NOTE — Procedures (Signed)
ENT, [facility]  [address]  [place] [address]    Procedure Note    Name: Linda Mccarty MRN:  [id number]   Date: 01/10/2022 Age: 31 y.o.  DOB:   October 11, 1990       31575 - LARYNGOSCOPY, FLEXIBLE DIAGNOSTIC (AMB ONLY)    Performed by: Lonia Farber, DO  Authorized by: Lonia Farber, DO    Time Out:     Immediately before the procedure, a time out was called:  Yes    Patient verified:  Yes    Procedure Verified:  Yes    Site Verified:  Yes    Indications for procedure: Unable to fully visualize laryngeal anatomy on indirect laryngoscopy and Obstructive sleep apnea / Snoring    Anesthesia: Oxymetazoline nasal spray    Description: The flexible endoscope was gently introduced into the nostril and passed along the floor of the nose to the nasopharynx. Adenoid was minimal and eustachian tubes normal. The retropalatal airway was patent.    The endoscope was passed to the oropharynx. Base of tongue displayed normal lingual tonsils, patent valelulla, and sharply defined upright epiglottis. Retrolingual airway was patent.    The larynx displayed normal true vocal cords with good mobility. False cords were normal. Arytenoid mucosa was pink with no edema.     The piriform recesses were symmetric without secretion. The hypopharynx was symmetric without lesion.    Findings: Symmetrical true vocal fold motion    The patient tolerated the procedure well.           Lonia Farber, DO

## 2022-03-09 ENCOUNTER — Telehealth (INDEPENDENT_AMBULATORY_CARE_PROVIDER_SITE_OTHER): Payer: Self-pay | Admitting: OTOLARYNGOLOGY

## 2022-03-09 NOTE — Telephone Encounter (Signed)
Spoke with [facility] in regards to referral. States they have tried to call pt 3 times to schedule, no answer or call back. They closed the referral.

## 2022-04-24 ENCOUNTER — Other Ambulatory Visit (HOSPITAL_COMMUNITY): Payer: Self-pay | Admitting: NURSE PRACTITIONER

## 2022-04-24 ENCOUNTER — Inpatient Hospital Stay (HOSPITAL_COMMUNITY)
Admission: RE | Admit: 2022-04-24 | Discharge: 2022-04-24 | Disposition: A | Discharge status: Home / Self Care | Payer: MEDICAID | Source: Ambulatory Visit | Attending: NURSE PRACTITIONER

## 2022-04-24 ENCOUNTER — Other Ambulatory Visit: Payer: Self-pay

## 2022-04-24 ENCOUNTER — Other Ambulatory Visit: Payer: MEDICAID | Attending: NURSE PRACTITIONER

## 2022-04-24 DIAGNOSIS — Z01818 Encounter for other preprocedural examination: Secondary | ICD-10-CM

## 2022-04-24 DIAGNOSIS — Z01810 Encounter for preprocedural cardiovascular examination: Secondary | ICD-10-CM

## 2022-04-24 LAB — CBC WITH DIFF
BASOPHIL #: 0 10*3/uL (ref 0.00–0.10)
BASOPHIL %: 1 % (ref 0–1)
EOSINOPHIL #: 0.1 10*3/uL (ref 0.00–0.50)
EOSINOPHIL %: 1 %
HCT: 38.3 % (ref 31.2–41.9)
HGB: 12.7 g/dL (ref 10.9–14.3)
LYMPHOCYTE #: 2.1 10*3/uL (ref 1.00–3.00)
LYMPHOCYTE %: 30 % (ref 16–44)
MCH: 28.8 pg (ref 24.7–32.8)
MCHC: 33.2 g/dL (ref 32.3–35.6)
MCV: 86.9 fL (ref 75.5–95.3)
MONOCYTE #: 0.4 10*3/uL (ref 0.30–1.00)
MONOCYTE %: 6 % (ref 5–13)
MPV: 10.1 fL (ref 7.9–10.8)
NEUTROPHIL #: 4.5 10*3/uL (ref 1.85–7.80)
NEUTROPHIL %: 63 % (ref 43–77)
PLATELETS: 246 10*3/uL (ref 140–440)
RBC: 4.41 10*6/uL (ref 3.63–4.92)
RDW: 13.7 % (ref 12.3–17.7)
WBC: 7.2 10*3/uL (ref 3.8–11.8)

## 2022-04-24 LAB — URINALYSIS, MACROSCOPIC
BILIRUBIN: NEGATIVE mg/dL
BLOOD: 0.5 mg/dL — AB
GLUCOSE: NEGATIVE mg/dL
KETONES: NEGATIVE mg/dL
LEUKOCYTES: NEGATIVE WBCs/uL
NITRITE: NEGATIVE
PH: 5.5 (ref 5.0–9.0)
PROTEIN: NEGATIVE mg/dL
SPECIFIC GRAVITY: 1.029 (ref 1.002–1.030)
UROBILINOGEN: NORMAL mg/dL

## 2022-04-24 LAB — COMPREHENSIVE METABOLIC PANEL, NON-FASTING
ALBUMIN/GLOBULIN RATIO: 1.4 (ref 0.8–1.4)
ALBUMIN: 4.5 g/dL (ref 3.5–5.7)
ALKALINE PHOSPHATASE: 52 U/L (ref 34–104)
ALT (SGPT): 14 U/L (ref 7–52)
ANION GAP: 8 mmol/L (ref 4–13)
AST (SGOT): 17 U/L (ref 13–39)
BILIRUBIN TOTAL: 0.4 mg/dL (ref 0.3–1.2)
BUN/CREA RATIO: 19 (ref 6–22)
BUN: 15 mg/dL (ref 7–25)
CALCIUM, CORRECTED: 9 mg/dL (ref 8.9–10.8)
CALCIUM: 9.4 mg/dL (ref 8.6–10.3)
CHLORIDE: 107 mmol/L (ref 98–107)
CO2 TOTAL: 25 mmol/L (ref 21–31)
CREATININE: 0.79 mg/dL (ref 0.60–1.30)
ESTIMATED GFR: 102 mL/min/{1.73_m2} (ref >59)
GLOBULIN: 3.2 (ref 2.9–5.4)
GLUCOSE: 104 mg/dL (ref 74–109)
OSMOLALITY, CALCULATED: 281 mOsm/kg (ref 270–290)
POTASSIUM: 3.5 mmol/L (ref 3.5–5.1)
PROTEIN TOTAL: 7.7 g/dL (ref 6.4–8.9)
SODIUM: 140 mmol/L (ref 136–145)

## 2022-04-24 LAB — PT/INR
INR: 1.04 (ref 0.84–1.10)
PROTHROMBIN TIME: 12.1 seconds (ref 9.8–12.7)

## 2022-04-24 LAB — ECG 12 LEAD
Atrial Rate: 63 {beats}/min
Calculated P Axis: 63 degrees
Calculated R Axis: 93 degrees
Calculated T Axis: 49 degrees
PR Interval: 174 ms
QRS Duration: 74 ms
QT Interval: 386 ms
QTC Calculation: 395 ms
Ventricular rate: 63 {beats}/min

## 2022-04-24 LAB — HGA1C (HEMOGLOBIN A1C WITH EST AVG GLUCOSE): HEMOGLOBIN A1C: 5 % (ref 4.0–6.0)

## 2022-04-24 LAB — URINALYSIS, MICROSCOPIC
BACTERIA: NEGATIVE /hpf
RBCS: 1 /hpf (ref <4)
SQUAMOUS EPITHELIAL: 3 /hpf (ref <28)
WBCS: 1 /hpf (ref <6)

## 2022-04-24 LAB — THYROID STIMULATING HORMONE WITH FREE T4 REFLEX: TSH: 2.595 u[IU]/mL (ref 0.450–5.330)

## 2022-04-24 LAB — PTT (PARTIAL THROMBOPLASTIN TIME): APTT: 35.2 seconds (ref 25.0–38.0)

## 2022-04-24 LAB — HIV 1 AND 2 RAPID SCREEN
HIV-1/2 ANTIBODY SCREEN: NONREACTIVE
HIV1-p24 ANTIGEN SCREEN: REACTIVE — AB

## 2022-04-24 LAB — HCG, PLASMA OR SERUM QUANTITATIVE, PREGNANCY: HCG, QUANTITATIVE: <1 m[IU]/mL (ref <=3.00)

## 2022-04-24 LAB — T-UPTAKE: T-UPTAKE: 49.3 % — ABNORMAL HIGH (ref 32.0–48.4)

## 2022-04-26 ENCOUNTER — Telehealth (HOSPITAL_COMMUNITY): Payer: Self-pay | Admitting: Special Hospital

## 2022-04-26 LAB — HEPATITIS A (HAV) IGM ANTIBODY: HAV IGM: NEGATIVE

## 2022-04-26 LAB — HIV-1 AND HIV-2 ANTIBODY CONFIRMATION AND DIFFERENTIATION, SERUM
ASSAY INTERP: NEGATIVE
HIV1 ANTIBODIES: NONREACTIVE
HIV2 ANTIBODIES: NONREACTIVE

## 2022-04-26 LAB — HEPATITIS C ANTIBODY SCREEN WITH REFLEX TO HCV PCR: HCV ANTIBODY QUALITATIVE: NEGATIVE

## 2022-04-26 LAB — HEPATITIS B SURFACE ANTIGEN: HBV SURFACE ANTIGEN QUALITATIVE: NEGATIVE

## 2022-04-26 NOTE — Progress Notes (Signed)
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 NP Adolphus Birchwood was contacted and the results were notified with read-back and acknowledgement. Linda Mccarty will be ordering and HIV-1 RNA PCR test.    Lytle Michaels, MD  Pathology, Anatomy and Laboratory Medicine  PGY-3

## 2023-07-16 ENCOUNTER — Encounter (INDEPENDENT_AMBULATORY_CARE_PROVIDER_SITE_OTHER): Payer: Self-pay

## 2023-11-06 ENCOUNTER — Encounter (HOSPITAL_COMMUNITY): Payer: Self-pay

## 2023-11-06 ENCOUNTER — Other Ambulatory Visit: Payer: Self-pay

## 2023-11-06 ENCOUNTER — Emergency Department (HOSPITAL_COMMUNITY): Payer: MEDICAID

## 2023-11-06 ENCOUNTER — Emergency Department
Admission: RE | Admit: 2023-11-06 | Discharge: 2023-11-06 | Disposition: A | Discharge status: Home / Self Care | Payer: MEDICAID | Attending: Physician Assistant | Admitting: Physician Assistant

## 2023-11-06 DIAGNOSIS — O208 Other hemorrhage in early pregnancy: Secondary | ICD-10-CM | POA: Insufficient documentation

## 2023-11-06 DIAGNOSIS — O4691 Antepartum hemorrhage, unspecified, first trimester: Secondary | ICD-10-CM

## 2023-11-06 DIAGNOSIS — O200 Threatened abortion: Secondary | ICD-10-CM | POA: Insufficient documentation

## 2023-11-06 DIAGNOSIS — O468X1 Other antepartum hemorrhage, first trimester: Secondary | ICD-10-CM

## 2023-11-06 DIAGNOSIS — Z3A01 Less than 8 weeks gestation of pregnancy: Secondary | ICD-10-CM | POA: Insufficient documentation

## 2023-11-06 DIAGNOSIS — N83201 Unspecified ovarian cyst, right side: Secondary | ICD-10-CM | POA: Insufficient documentation

## 2023-11-06 DIAGNOSIS — O3481 Maternal care for other abnormalities of pelvic organs, first trimester: Secondary | ICD-10-CM | POA: Insufficient documentation

## 2023-11-06 LAB — COMPREHENSIVE METABOLIC PANEL, NON-FASTING
ALBUMIN/GLOBULIN RATIO: 1.4 (ref 0.8–1.4)
ALBUMIN: 4.2 g/dL (ref 3.5–5.7)
ALKALINE PHOSPHATASE: 38 U/L (ref 34–104)
ALT (SGPT): 9 U/L (ref 7–52)
ANION GAP: 9 mmol/L (ref 4–13)
AST (SGOT): 15 U/L (ref 13–39)
BILIRUBIN TOTAL: 0.4 mg/dL (ref 0.3–1.0)
BUN/CREA RATIO: 16 (ref 6–22)
BUN: 12 mg/dL (ref 7–25)
CALCIUM, CORRECTED: 8.7 mg/dL — ABNORMAL LOW (ref 8.9–10.8)
CALCIUM: 8.9 mg/dL (ref 8.6–10.3)
CHLORIDE: 106 mmol/L (ref 98–107)
CO2 TOTAL: 22 mmol/L (ref 21–31)
CREATININE: 0.74 mg/dL (ref 0.60–1.30)
ESTIMATED GFR: 109 mL/min/1.73mˆ2 (ref >59)
GLOBULIN: 2.9 (ref 2.0–3.5)
GLUCOSE: 80 mg/dL (ref 74–109)
OSMOLALITY, CALCULATED: 273 mosm/kg (ref 270–290)
POTASSIUM: 3.8 mmol/L (ref 3.5–5.1)
PROTEIN TOTAL: 7.1 g/dL (ref 6.4–8.9)
SODIUM: 137 mmol/L (ref 136–145)

## 2023-11-06 LAB — CBC WITH DIFF
BASOPHIL #: 0 x10ˆ3/uL (ref 0.00–0.10)
BASOPHIL %: 1 % (ref 0–1)
EOSINOPHIL #: 0.1 x10ˆ3/uL (ref 0.00–0.50)
EOSINOPHIL %: 1 % (ref 1–7)
HCT: 34.6 % (ref 31.2–41.9)
HGB: 11.8 g/dL (ref 10.9–14.3)
LYMPHOCYTE #: 2.1 x10ˆ3/uL (ref 1.10–3.10)
LYMPHOCYTE %: 28 % (ref 16–46)
MCH: 28.9 pg (ref 24.7–32.8)
MCHC: 34.2 g/dL (ref 32.3–35.6)
MCV: 84.5 fL (ref 75.5–95.3)
MONOCYTE #: 0.5 x10ˆ3/uL (ref 0.20–0.90)
MONOCYTE %: 7 % (ref 4–11)
MPV: 9.3 fL (ref 7.9–10.8)
NEUTROPHIL #: 4.8 x10ˆ3/uL (ref 1.90–8.20)
NEUTROPHIL %: 64 % (ref 43–77)
PLATELETS: 238 x10ˆ3/uL (ref 140–440)
RBC: 4.1 x10ˆ6/uL (ref 3.63–4.92)
RDW: 13.6 % (ref 12.3–17.7)
WBC: 7.5 x10ˆ3/uL (ref 3.8–11.8)

## 2023-11-06 LAB — URINALYSIS, MACROSCOPIC
BILIRUBIN: NEGATIVE mg/dL
BLOOD: 1 mg/dL — AB
GLUCOSE: NEGATIVE mg/dL
KETONES: NEGATIVE mg/dL
LEUKOCYTES: 75 WBCs/uL — AB
NITRITE: NEGATIVE
PH: 5.5 (ref 5.0–9.0)
PROTEIN: NEGATIVE mg/dL
SPECIFIC GRAVITY: 1.023 (ref 1.002–1.030)
UROBILINOGEN: NORMAL mg/dL

## 2023-11-06 LAB — URINALYSIS, MICROSCOPIC
BACTERIA: NEGATIVE /HPF
RBCS: 1 /HPF (ref <4)
SQUAMOUS EPITHELIAL: 23 /HPF (ref <28)
WBCS: 5 /HPF (ref <6)

## 2023-11-06 LAB — HCG, PLASMA OR SERUM QUANTITATIVE, PREGNANCY: HCG, QUANTITATIVE: 26360.18 m[IU]/mL — ABNORMAL HIGH (ref <5.00)

## 2023-11-06 NOTE — ED Nurses Note (Signed)
 Patient ambulatory to ED room 31. She says she is pregnant and started cramping and having vaginal bleeding yesterday. She complains of pain in her left lower abdomen, vagina, and butt.

## 2023-11-06 NOTE — ED Nurses Note (Signed)
 Patient D/C home at this time. Instructions reviewed and understanding verbalized. Discharge paperwork provided. Patient left department via ambulation.

## 2023-11-06 NOTE — ED Provider Notes (Shared)
 [facility]  ED Primary Provider Note  Patient Name: Linda Mccarty  Patient Age: 33 y.o.  Date of Birth: 1990-10-05    Chief Complaint: Pregnancy Problem      History of Present Illness       Linda Mccarty is a 33 y.o. female who had concerns including Pregnancy Problem. ***        Review of Systems     No other overt Review of Systems are noted to be positive except noted in the HPI.    { Be sure to review and modify ROS as appropriate. As of Feb 26, 2021 you are only required to have a medically appropriate ROS and PE for billing purposes. This help text will disappear when signing your note.:123}  Historical Data   History Reviewed This Encounter:   ***History has not been Marked as Reviewed - Go to the top of the ED Triage Tab to document your review. Make sure history sections are updated and accurate - Click Here to jump to History for update.***        Physical Exam   ED Triage Vitals [11/06/23 1452]   BP (Non-Invasive) 120/79   Heart Rate 79   Respiratory Rate 18   Temperature 36.2 C (97.2 F)   SpO2 99 %   Weight 84.8 kg (187 lb)   Height 1.615 m (5' 3.6)     {Be sure to review and modify Physical Exam as appropriate. As of Feb 26, 2021 you are only required to have a medically appropriate ROS and PE for billing purposes. This help text will disappear when signing your note.:123}    ***          Procedures      Patient Data   {Click here to open the ED Workup Activity for clinical data review *This link will automatically disappear upon signing your note*:123}  Labs Ordered/Reviewed   URINALYSIS, MACROSCOPIC - Abnormal; Notable for the following components:       Result Value    APPEARANCE Turbid (*)     LEUKOCYTES 75 (*)     BLOOD 1.0 (*)     All other components within normal limits   URINALYSIS, MICROSCOPIC - Normal   URINE CULTURE,ROUTINE   URINALYSIS, MACROSCOPIC AND MICROSCOPIC W/CULTURE REFLEX    Narrative:     The following orders were created for panel  order URINALYSIS, MACROSCOPIC AND MICROSCOPIC W/CULTURE REFLEX.  Procedure                               Abnormality         Status                     ---------                               -----------         ------                     URINALYSIS, MACROSCOPIC[[id number]]      Abnormal            Final result               URINALYSIS, MICROSCOPIC[[id number]]      Normal  Final result                 Please view results for these tests on the individual orders.   CBC/DIFF    Narrative:     The following orders were created for panel order CBC/DIFF.  Procedure                               Abnormality         Status                     ---------                               -----------         ------                     CBC WITH IPQQ[[id number]]                                    In process                   Please view results for these tests on the individual orders.   COMPREHENSIVE METABOLIC PANEL, NON-FASTING   HCG, PLASMA OR SERUM QUANTITATIVE, PREGNANCY   PROGESTERONE    CBC WITH DIFF   EXTRA TUBES    Narrative:     The following orders were created for panel order EXTRA TUBES.  Procedure                               Abnormality         Status                     ---------                               -----------         ------                     BLUE TOP TUBE[[id number]]                                    In process                 GRAY TOP TUBE[[id number]]                                    In process                   Please view results for these tests on the individual orders.   BLUE TOP TUBE   GRAY TOP TUBE       No orders to display       Medical Decision Making     ED clinical impression missing, please click on the following link to add Clinical Impressions ***  then refresh the note prior to signing.  {Be sure to fill out the MDM SmartBlock in Notewriter to the left.  Do not modify this italicized text, it will disappear upon signing your note:123}  MDM        Studies Assessed: ***    EKG:    This EKG interpreted by me shows:    Rate: ***    Interpretation: ***      MDM Narrative:  ***        Differential includes but not limited to:  ***      Follow-Up Discussion: ***    Please see documentation above for specific labs and radiology.    Decision for Moderate Risk in the ED encounter:  ***Prescription Medications  Decision for High Risk/High Medical Decision Making and Treatment in the ED:  ***Decision of hospitalization or escalation of hospital-level care.  ***Parenteral Controlled Substances          A 30ml/kg fluid bolus not ordered due to ***.  Please see the EMR for the total amount ordered/administered.                                   {Disposition:38159}             Based upon the clinical setting, the likely diagnosis/impression include:    ED clinical impression missing, please click on the following link to add Clinical Impressions ***  then refresh the note prior to signing.      There are no discharge medications for this patient.      Lauraine LITTIE Crock PA-C  Department of Emergency Medicine  [facility]              {Remember to refresh your note prior to signing. Use Control + F11 or click the refresh button at the bottom of the note. This reminder text will automatically disappear when you sign your note.:123}

## 2023-11-06 NOTE — ED Provider Notes (Signed)
 [facility] - Emergency Department  ED Primary Note  History of Present Illness  Linda Mccarty is a 33 year old female who presents with cramping and bleeding during early pregnancy.    She is currently pregnant, confirmed by multiple home pregnancy tests, though she has not yet had a medical confirmation. This is her fourth pregnancy, with one previous birth and two elective terminations. She has not experienced similar symptoms in her previous pregnancies.    Yesterday, she began experiencing severe cramping in her vagina, buttocks, and abdomen, accompanied by bleeding. The bleeding varies from spotting to heavier flow, but it is not soaking through pads. The color of the blood ranges from light to dark red. She rates her pain as 6.5 out of 10.    Her LMP started on September 17, 2023. She has an appointment scheduled with Dr Celestia on the 23rd of this month.    No history of surgeries or other medical conditions. No pain during urination and no current itching, although she experienced itching previously which has since resolved.  Physical Exam   ED Triage Vitals [11/06/23 1452]   BP (Non-Invasive) 120/79   Heart Rate 79   Respiratory Rate 18   Temperature 36.2 C (97.2 F)   SpO2 99 %   Weight 84.8 kg (187 lb)   Height 1.615 m (5' 3.6)     Physical Exam  GENERAL: Alert, cooperative, well developed, no acute distress  HEENT: Normocephalic, normal oropharynx, moist mucous membranes  CHEST: Clear to auscultation bilaterally, No wheezes, rhonchi, or crackles  CARDIOVASCULAR: Normal heart rate and rhythm, S1 and S2 normal without murmurs  ABDOMEN: Soft, mild LLQ tenderness, non-distended, without organomegaly, Normal bowel sounds  EXTREMITIES: No cyanosis or edema  NEUROLOGICAL: Cranial nerves grossly intact, Moves all extremities without gross motor or sensory deficit  Patient Data   Results  LABS  HCG: 73639 (11/05/2023)    RADIOLOGY  Pelvic ultrasound: Viable intrauterine gestation, suspected  adjacent subchorionic hemorrhage, complex right adnexal cyst, fetal heart rate 132 bpm, gestational age [redacted] weeks 5 days (11/05/2023)    Labs Ordered/Reviewed   COMPREHENSIVE METABOLIC PANEL, NON-FASTING - Abnormal; Notable for the following components:       Result Value    CALCIUM, CORRECTED 8.7 (*)     All other components within normal limits    Narrative:     Estimated Glomerular Filtration Rate (eGFR) is calculated using the CKD-EPI (2021) equation, intended for patients 18 years of age and older. If gender is not documented or unknown, there will be no eGFR calculation.     HCG, PLASMA OR SERUM QUANTITATIVE, PREGNANCY - Abnormal; Notable for the following components:    HCG, QUANTITATIVE 26,360.18 (*)     All other components within normal limits   URINALYSIS, MACROSCOPIC - Abnormal; Notable for the following components:    APPEARANCE Turbid (*)     LEUKOCYTES 75 (*)     BLOOD 1.0 (*)     All other components within normal limits   CBC WITH DIFF - Normal   URINALYSIS, MICROSCOPIC - Normal   URINE CULTURE,ROUTINE   CBC/DIFF    Narrative:     The following orders were created for panel order CBC/DIFF.  Procedure                               Abnormality         Status                     ---------                               -----------         ------  CBC WITH IPQQ[[id number]]                Normal              Final result                 Please view results for these tests on the individual orders.   URINALYSIS, MACROSCOPIC AND MICROSCOPIC W/CULTURE REFLEX    Narrative:     The following orders were created for panel order URINALYSIS, MACROSCOPIC AND MICROSCOPIC W/CULTURE REFLEX.  Procedure                               Abnormality         Status                     ---------                               -----------         ------                     URINALYSIS, MACROSCOPIC[[id number]]      Abnormal            Final result               URINALYSIS, MICROSCOPIC[[id number]]      Normal               Final result                 Please view results for these tests on the individual orders.   PROGESTERONE    EXTRA TUBES    Narrative:     The following orders were created for panel order EXTRA TUBES.  Procedure                               Abnormality         Status                     ---------                               -----------         ------                     BLUE TOP TUBE[[id number]]                                    In process                 GRAY TOP TUBE[[id number]]                                    In process                   Please view results for these tests on the individual orders.   BLUE TOP TUBE   GRAY TOP TUBE     US  TRANSVAGINAL OB  [id number]   Final Result by Edi, Radresults In (09/10 1621)  VIABLE INTRAUTERINE GESTATION. SUSPECTED ADJACENT SUBCHORIONIC HEMORRHAGE.      COMPLEX RIGHT ADNEXAL CYST WHICH MAY REPRESENT A CORPUS LUTEAL CYST.         Radiologist location ID: [facility]           Medical Decision Making          Medical Decision Making  33 year old woman, gravida 4 para 1, presented with cramping and vaginal bleeding in early pregnancy, with last menstrual period on July 22. She has no significant past medical or surgical history. On exam, she reported moderate pain but was hemodynamically stable. Labs revealed an HCG of 26,360 and ultrasound showed a viable intrauterine pregnancy at 6 weeks 5 days with subchorionic hemorrhage and a right adnexal cyst, likely corpus luteal.    Differential diagnosis includes, but is not limited to:  - Threatened abortion (subchorionic hemorrhage) in early intrauterine pregnancy: Viable intrauterine pregnancy with cramping and vaginal bleeding, and ultrasound evidence of subchorionic hemorrhage, consistent with threatened abortion.  - Adnexal cyst (likely corpus luteal cyst) in early pregnancy: Ultrasound revealed a complex right adnexal cyst, likely a corpus luteal cyst, which is a common and typically benign finding in early  pregnancy.    Threatened abortion (subchorionic hemorrhage) in early intrauterine pregnancy  - Monitored bleeding and cramping symptoms.  - Provided reassurance regarding the viability of the pregnancy as per the ultrasound findings.  - Advised follow-up with scheduled appointment.    Adnexal cyst (likely corpus luteal cyst) in early pregnancy  - Monitored for any changes in symptoms related to the adnexal cyst.  - Advised reassessment during follow-up appointment.  Medical Decision Making  Amount and/or Complexity of Data Reviewed  Labs: ordered.  Radiology:  Decision-making details documented in ED Course.  ECG/medicine tests: independent interpretation performed.      ED Course as of 11/06/23 1703   Wed Nov 06, 2023   1623 US  TRANSVAGINAL OB  [id number]  VIABLE INTRAUTERINE GESTATION. SUSPECTED ADJACENT SUBCHORIONIC HEMORRHAGE.     COMPLEX RIGHT ADNEXAL CYST WHICH MAY REPRESENT A CORPUS LUTEAL CYST.     1634 HCG, QUANTITATIVE(!): 26,360.18   1634 CBC/DIFF  normal   1634 COMPREHENSIVE METABOLIC PANEL, NON-FASTING(!)  normal   1635 URINALYSIS, MACROSCOPIC AND MICROSCOPIC W/CULTURE REFLEX(!)  No significant abnormalities                 Clinical Impression   Threatened abortion in first trimester (Primary)   Subchorionic hematoma in first trimester, single or unspecified fetus   Right ovarian cyst       Disposition: Discharged           This note was created with assistance from Abridge via capture of conversational audio.  Consent was obtained from the patient prior to recording.

## 2023-11-06 NOTE — ED Triage Notes (Signed)
 Seven weeks pregnant cramping and spotting

## 2023-11-06 NOTE — ED Triage Notes (Signed)
 [facility] - Emergency Department   Physician/APP in Triage Note  Medical Screening Exam     Date and Time of Assessment: 11/06/2023 14:51     Chief Complaint   Patient presents with    Pregnancy Problem       Brief HPI: Reports positive home pregnancy test. States she is 7 weeks. Vaginal bleeding that started yesterday.  G-4. P-1.    Focused Physical Exam:   ED Triage Vitals [11/06/23 1452]   BP (Non-Invasive) 120/79   Heart Rate 79   Respiratory Rate 18   Temperature 36.2 C (97.2 F)   SpO2 99 %   Weight 84.8 kg (187 lb)   Height 1.615 m (5' 3.6)     Gen: Nontoxic in appearance.  Head:  Atraumatic  Skin: Warm, dry.  Psych: Patient does have capacity.     Assessment: Medical Screening Exam.     Plan/MDM at Triage:  I have considered labs, imaging, EKG.  These have been ordered as clinically indicated.  I have considered that the patient may need medications.  However, due to treatment space limitation, medications considered have to be appropriate for the waiting room.    I have considered the patient's chronic conditions and have counseled the patient that we will be evaluating for the presence of an emergency medical condition today in which their chronic conditions may or may not play a role.  I have advised that the patient should notify their PCP of today's visit regardless of disposition.  I have considered the patient's social determinants of health and health care literacy while determining the patient's initial plan of care.  I have further advised that the patient will possibly see a 2nd emergency provider today during their stay to help promote throughput.     I have performed an initial medical screening evaluation in order to determine if the patient has an emergency medical condition. The patient has been evaluated.  Imminent life, limb, or sight threatening emergency will be taken to the treatment area immediately. The patient's orders will be reviewed by nursing staff and placed  in the treatment area as soon as possible as bed capacity allows.  Should the patient's status change, or a new sign/symptom develop, immediate notification of nursing or ED staff has been advised.         Preliminary Plan:  Labs ordered  Imaging ordered  Patient roomed in ED

## 2023-11-06 NOTE — Discharge Instructions (Addendum)
 VISIT SUMMARY:  You came in today due to cramping and bleeding during early pregnancy. An ultrasound confirmed a viable pregnancy but also showed a subchorionic hemorrhage, which means there is a small bleed around the pregnancy. Additionally, a cyst was found on your right ovary.    YOUR PLAN:  INTRAUTERINE PREGNANCY WITH SUBCHORIONIC HEMORRHAGE (THREATENED ABORTION): You have a viable pregnancy, but there is a small bleed around the pregnancy, which is called a subchorionic hemorrhage. This can sometimes lead to a miscarriage, but many pregnancies continue without further issues.  -Follow up with your obstetrician on the 23rd of this month for further evaluation and management.  -Monitor your symptoms. If you experience increased bleeding, severe pain, or any other concerning symptoms, seek medical attention immediately.    RIGHT ADNEXAL CYST, LIKELY CORPUS LUTEAL CYST: A cyst was found on your right ovary, which is likely a corpus luteal cyst. These cysts are common in early pregnancy and usually resolve on their own.  -No immediate treatment is necessary. Your obstetrician will monitor the cyst during your follow-up visits.       Contains text generated by Abridge

## 2023-11-07 LAB — PROGESTERONE: PROGESTERONE: 14.9 ng/mL

## 2023-11-08 LAB — URINE CULTURE,ROUTINE: URINE CULTURE: NO GROWTH
